# Patient Record
Sex: Female | Born: 1990 | Race: White | Hispanic: No | Marital: Single | Smoking: Current some day smoker
Health system: Southern US, Community
[De-identification: ages and names within clinical notes are randomized; demographics above are authoritative.]

---

## 2012-02-02 ENCOUNTER — Other Ambulatory Visit: Payer: Self-pay | Admitting: Family Medicine

## 2012-02-02 MED ORDER — NORETHINDRONE 0.35 MG PO TABS: 1.0000 | ORAL_TABLET | Freq: Every day | ORAL | Status: DC

## 2012-10-30 ENCOUNTER — Ambulatory Visit: Payer: BC Managed Care – PPO

## 2012-10-30 ENCOUNTER — Ambulatory Visit (INDEPENDENT_AMBULATORY_CARE_PROVIDER_SITE_OTHER): Payer: BC Managed Care – PPO | Admitting: Family Medicine

## 2012-10-30 VITALS — BP 112/63 | HR 64 | Temp 98.1°F | Resp 16 | Ht 70.5 in | Wt 145.0 lb

## 2012-10-30 DIAGNOSIS — M79609 Pain in unspecified limb: Secondary | ICD-10-CM

## 2012-10-30 DIAGNOSIS — G43909 Migraine, unspecified, not intractable, without status migrainosus: Secondary | ICD-10-CM

## 2012-10-30 DIAGNOSIS — M79673 Pain in unspecified foot: Secondary | ICD-10-CM

## 2012-10-30 LAB — POCT URINE PREGNANCY: Preg Test, Ur: NEGATIVE

## 2012-10-30 MED ORDER — OXAPROZIN 600 MG PO TABS: ORAL_TABLET | ORAL | Status: DC

## 2012-10-30 MED ORDER — BUTALBITAL-APAP-CAFFEINE 50-325-40 MG PO TABS: 1.0000 | ORAL_TABLET | Freq: Four times a day (QID) | ORAL | Status: AC | PRN

## 2012-10-30 NOTE — Progress Notes (Signed)
Subjective: Patient has been having pain in the lateral aspect of her right foot since yesterday. 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 She usually wears 4 he feels, though yesterday she wore flat. Knows of no specific injury. Has not been doing any running or jumping, just long hours of standing and working back and forth in the business. It hurt in the evening even though she was elevating it. She's never had problems with them in the past.  Occasionally gets a migraine, usually severe. The pain medicine she had in the past for these have helped, but she is out of the medication and would like a refill.  Objective: Foot looks grossly okay. Head no major abnormalities compared to the opposite foot. Pulses adequate. Is able to wiggle toes okay. She's tender along the lateral aspect of the foot, at the tarsal metatarsal joint area and along the proximal fifth metatarsal.  Assessment: Foot pain Migraine  Plan: X-ray of foot. HCG was negative. [facility] reading (PRIMARY) by  Dr. Alwyn Ren No fracture noted  Will treat with anti-inflammatory medication and a firm sole postop shoe Refill the migraine medication. We pulled the old chart it looks like Fioricet was the medication she received.  Dx: Foot pain/strain midfoot Migraines

## 2012-10-30 NOTE — Patient Instructions (Addendum)
Take anti-inflammatory medicine, Daypro, twice daily. It with food.  Try to stay off the foot as much as she can this weekend to allow time to rest. Wear the firm sole shoe.  If not improving by 5-6 days from now return.

## 2013-12-15 ENCOUNTER — Ambulatory Visit (INDEPENDENT_AMBULATORY_CARE_PROVIDER_SITE_OTHER): Payer: BC Managed Care – PPO | Admitting: Family Medicine

## 2013-12-15 VITALS — BP 110/70 | HR 62 | Temp 98.0°F | Resp 16 | Ht 70.5 in | Wt 144.8 lb

## 2013-12-15 DIAGNOSIS — S40019A Contusion of unspecified shoulder, initial encounter: Secondary | ICD-10-CM

## 2013-12-15 DIAGNOSIS — S161XXA Strain of muscle, fascia and tendon at neck level, initial encounter: Secondary | ICD-10-CM

## 2013-12-15 DIAGNOSIS — G43909 Migraine, unspecified, not intractable, without status migrainosus: Secondary | ICD-10-CM

## 2013-12-15 DIAGNOSIS — S139XXA Sprain of joints and ligaments of unspecified parts of neck, initial encounter: Secondary | ICD-10-CM

## 2013-12-15 DIAGNOSIS — S40012A Contusion of left shoulder, initial encounter: Secondary | ICD-10-CM

## 2013-12-15 MED ORDER — METHOCARBAMOL 500 MG PO TABS
ORAL_TABLET | ORAL | Status: DC
Start: 2013-12-15 — End: 2014-06-19

## 2013-12-15 MED ORDER — BUTALBITAL-APAP-CAFFEINE 50-325-40 MG PO TABS: 1.0000 | ORAL_TABLET | Freq: Four times a day (QID) | ORAL | Status: AC | PRN
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
## 2013-12-15 NOTE — Patient Instructions (Signed)
Take the muscle relaxants 1 in the morning, one afternoon, and 2 at bedtime. May want to skip the morning pill a day she working.  Apply ice or ice alternating with heat several times daily to the left shoulder into your neck  Take the ibuprofen 800 mg 3 times daily as needed for pain  Use the Fioricet as needed for headaches.

## 2014-06-19 ENCOUNTER — Emergency Department (HOSPITAL_COMMUNITY)
Admission: EM | Admit: 2014-06-19 | Discharge: 2014-06-19 | Disposition: A | Discharge status: Home / Self Care | Payer: BC Managed Care – PPO | Attending: Emergency Medicine | Admitting: Emergency Medicine

## 2014-06-19 ENCOUNTER — Encounter (HOSPITAL_COMMUNITY): Payer: Self-pay | Admitting: Emergency Medicine

## 2014-06-19 DIAGNOSIS — K029 Dental caries, unspecified: Secondary | ICD-10-CM | POA: Insufficient documentation

## 2014-06-19 DIAGNOSIS — K089 Disorder of teeth and supporting structures, unspecified: Secondary | ICD-10-CM | POA: Insufficient documentation

## 2014-06-19 DIAGNOSIS — F17200 Nicotine dependence, unspecified, uncomplicated: Secondary | ICD-10-CM | POA: Diagnosis not present

## 2014-06-19 DIAGNOSIS — K0889 Other specified disorders of teeth and supporting structures: Secondary | ICD-10-CM

## 2014-06-19 DIAGNOSIS — Z791 Long term (current) use of non-steroidal anti-inflammatories (NSAID): Secondary | ICD-10-CM | POA: Diagnosis not present

## 2014-06-19 MED ORDER — HYDROCODONE-ACETAMINOPHEN 5-325 MG PO TABS
2.0000 | ORAL_TABLET | Freq: Once | ORAL | Status: AC
  Administered 2014-06-19: 2 via ORAL
  Filled 2014-06-19: qty 2

## 2014-06-19 MED ORDER — PENICILLIN V POTASSIUM 500 MG PO TABS: 500.0000 mg | ORAL_TABLET | Freq: Four times a day (QID) | ORAL | Status: AC

## 2014-06-19 MED ORDER — HYDROCODONE-ACETAMINOPHEN 5-325 MG PO TABS: 1.0000 | ORAL_TABLET | Freq: Four times a day (QID) | ORAL | Status: AC | PRN

## 2014-06-19 NOTE — ED Notes (Signed)
Pt c/o dental pain: Lt. Posterior. Teeth have broken off. Pt states she has dentist but has not followed up to have a procedure d/t to no pain.

## 2014-06-19 NOTE — Discharge Instructions (Signed)
Dental Pain °A tooth ache may be caused by cavities (tooth decay). Cavities expose the nerve of the tooth to air and hot or cold temperatures. It may come from an infection or abscess (also called a boil or furuncle) around your tooth. It is also often caused by dental caries (tooth decay). This causes the pain you are having. °DIAGNOSIS  °Your caregiver can diagnose this problem by exam. °TREATMENT  °· If caused by an infection, it may be treated with medications which kill germs (antibiotics) and pain medications as prescribed by your caregiver. Take medications as directed. °· Only take over-the-counter or prescription medicines for pain, discomfort, or fever as directed by your caregiver. °· Whether the tooth ache today is caused by infection or dental disease, you should see your dentist as soon as possible for further care. °SEEK MEDICAL CARE IF: °The exam and treatment you received today has been provided on an emergency basis only. This is not a substitute for complete medical or dental care. If your problem worsens or new problems (symptoms) appear, and you are unable to meet with your dentist, call or return to this location. °SEEK IMMEDIATE MEDICAL CARE IF:  °· You have a fever. °· You develop redness and swelling of your face, jaw, or neck. °· You are unable to open your mouth. °· You have severe pain uncontrolled by pain medicine. °MAKE SURE YOU:  °· Understand these instructions. °· Will watch your condition. °· Will get help right away if you are not doing well or get worse. °Document Released: 11/10/2005 Document Revised: 02/02/2012 Document Reviewed: 06/28/2008 °ExitCare® Patient Information ©2015 [employer]. This information is not intended to replace advice given to you by your health care provider. Make sure you discuss any questions you have with your health care provider. ° °Emergency Department Resource Guide °1) Find a Doctor and Pay Out of Pocket °Although you won't have to find out who  is covered by your insurance plan, it is a good idea to ask around and get recommendations. You will then need to call the office and see if the doctor you have chosen will accept you as a new patient and what types of options they offer for patients who are self-pay. Some doctors offer discounts or will set up payment plans for their patients who do not have insurance, but you will need to ask so you aren't surprised when you get to your appointment. ° °2) Contact Your Local Health Department °Not all health departments have doctors that can see patients for sick visits, but many do, so it is worth a call to see if yours does. If you don't know where your local health department is, you can check in your phone book. The CDC also has a tool to help you locate your state's health department, and many state websites also have listings of all of their local health departments. ° °3) Find a Walk-in Clinic °If your illness is not likely to be very severe or complicated, you may want to try a walk in clinic. These are popping up all over the country in pharmacies, drugstores, and shopping centers. They're usually staffed by nurse practitioners or physician assistants that have been trained to treat common illnesses and complaints. They're usually fairly quick and inexpensive. However, if you have serious medical issues or chronic medical problems, these are probably not your best option. ° °No Primary Care Doctor: °- Call [facility] at  832-8000 - they can help you locate a primary   care doctor that  accepts your insurance, provides certain services, etc. °- Physician Referral Service- 1-800-533-3463 ° °Chronic Pain Problems: °Organization         Address  Phone   Notes  °[facility]  (336) 297-2271 Patients need to be referred by their primary care doctor.  ° °Medication Assistance: °Organization         Address  Phone   Notes  °[place] [facility] [address]  [address] °[place], [place] [address] (336) 641-8030 --Must be a resident of [place] °-- Must have NO insurance coverage whatsoever (no Medicaid/ Medicare, etc.) °-- The pt. MUST have a primary care doctor that directs their care regularly and follows them in the community °  °[facility]  (866) 331-1348   °[employer]  (888) 892-1162   ° °Agencies that provide inexpensive medical care: °Organization         Address  Phone   Notes  °[facility]  (336) 832-8035   °[facility]    (336) 832-7272   °[facility] [address] °[place], [place] [address] (336) 832-4777   °[facility] [address], °[place] (336) 271-4999   °[facility]    (336) 373-0678   °[facility]    (336) 272-1050   °[facility] ° [address], [place] Phone:  (336) 832-4444, Fax:  (336) 832-4440 Hours of Operation:  9 am - 6 pm, M-F.  Also accepts Medicaid/Medicare and self-pay.  ° [facility] ° [address], [place] Phone: (336) 832-3150, Fax: (336) 832-3151. Hours of Operation:  8:30 am - 5:30 pm, M-F.  Also accepts Medicaid and self-pay.  °[facility] [place] [address], [place] Phone: (336) 878-6027   °[facility] [address], [place], [place] (336)723-1848, Ext. 123 Mondays & Thursdays: 7-9 AM.  First 15 patients are seen on a first come, first serve basis. °  ° °Medicaid-accepting [place] Providers: ° °Organization         Address  Phone   Notes  °[facility] [address], [place] (336) 641-2100 Also accepts self-pay patients.  °[facility] [address], [place] ° (336) 856-9996   °[facility] [address], [place] (336) 288-8857   °[facility] [address] [place] [address], [place] (336) 299-7000   °Veita Bland [address], [place]  ° (336) 373-1557 Only accepts [place] Access Medicaid patients after they have their name applied to their card.  ° °Self-Pay (no insurance) in [place]: ° °Organization         Address  Phone   Notes  °Sickle Cell Patients, [facility] [address], [place] (336) 832-1970   °[facility] [address], [place] (336) 832-4400   °[facility] ° [address], [place] (336) 992-4800   °[facility]/Dr. Osei-Bonsu ° [address] [place] [address], [place] or [address], [place] (336) 841-8500 Phone number for both [place] and [place] locations is the same.  °[facility] [address], [place] (336) 299-0000   °[facility]  [address] [place] [address],  or [address] (336) 852-7530 °(336) 878-2260   °  [facility] [address], [place] (336) 350-1642, phone; (336) 294-5005, fax Sees patients 1st and 3rd Saturday of every month.  Must not qualify for public or private insurance (i.e. Medicaid, Medicare, [place] Health Choice, Veterans' Benefits) • Household income should be no more than 200% of the poverty level •The clinic cannot treat you if you are pregnant or think you are pregnant • Sexually transmitted diseases are not treated at the clinic.  ° ° °Dental Care: °Organization         Address  Phone  Notes  °[facility] [facility] [address], [place] (336) 641-6152 Accepts children up to age 21 who are enrolled in Medicaid or [place] Health Choice; pregnant women with a Medicaid card; and children who have applied for Medicaid or [place] Health Choice, but were declined, whose parents can pay a reduced fee at time of service.  °[facility] [place]  [address], [place] (336) 641-7733 Accepts children up to age 21 who are  enrolled in Medicaid or [place] Health Choice; pregnant women with a Medicaid card; and children who have applied for Medicaid or [place] Health Choice, but were declined, whose parents can pay a reduced fee at time of service.  °[facility] ° [address], [place] (336) 641-4533 Patients are seen by appointment only. Walk-ins are not accepted. [facility] will see patients 18 years of age and older. °Monday - Tuesday (8am-5pm) °Most Wednesdays (8:30-5pm) °$30 per visit, cash only  °[facility] ° [address], [place] (336) 641-4533 Patients are seen by appointment only. Walk-ins are not accepted. [facility] will see patients 18 years of age and older. °One Wednesday Evening (Monthly: Volunteer Based).  $30 per visit, cash only  °[facility]  (919) 537-3737 for adults; Children under age 4, call Graduate Pediatric Dentistry at (919) [phone]. Children aged 4-14, please call (919) [phone] to request a pediatric application. ° Dental services are provided in all areas of dental care including fillings, crowns and bridges, complete and partial dentures, implants, gum treatment, root canals, and extractions. Preventive care is also provided. Treatment is provided to both adults and children. °Patients are selected via a lottery and there is often a waiting list. °  °[facility] [address], °[place] ° (336) 763-8833 [email or web address] °  °[facility] [address], [place], [place] (336)723-1848, Ext. 123 Second and Fourth Thursday of each month, opens at 6:30 AM; Clinic ends at 9 AM.  Patients are seen on a first-come first-served basis, and a limited number are seen during each clinic.  ° °[facility] ° [address], [place], [place] (336) 723-7904   Eligibility Requirements °You must have lived in [place], [place], or [place] counties for at least the last three months. °  You  cannot be eligible for state or federal sponsored healthcare insurance, including Veterans Administration, Medicaid, or Medicare. °  You generally cannot be eligible for healthcare insurance through your employer.  °  How to apply: °Eligibility screenings are held every Tuesday and Wednesday afternoon from 1:00 pm until 4:00 pm. You do not need an appointment for the interview!  °[facility] [address], [place], [place] 336-631-2330   °[place] [facility]  336-342-8273   °[facility]  336-703-3100   °[facility]  336-570-6415   ° °Behavioral Health Resources in the Community: °Intensive Outpatient Programs °Organization         Address  Phone  Notes  °[facility] [address], [place], [place] 336-878-6098   °[facility] [address], [place], [place] 336-832-9800   °[facility] [address], [place], [place] ° 336-882-2125   °[facility] [address],  °[place], [place] 1-800-853-5163 or 336-641-4981   °Substance Abuse Resources °Organization         Address  Phone  Notes  °[facility]  336-882-2125   °[facility]  336-784-9470   °[facility]  336-285-9073   °[facility]  336-845-3988   °Residential & Outpatient Substance Abuse Program  1-800-659-3381   °Psychological Services °Organization         Address  Phone  Notes  °[facility]  336- 832-9600   °[facility]  336- 378-7881   °[facility] [address], [place] 1-800-853-5163 or 336-641-4981   ° °Mobile Crisis Teams °Organization         Address  Phone  Notes  °[facility], Mobile Crisis Care Unit  1-877-626-1772   °Assertive °[facility] ° [address] [place], [place] 336-834-9664   °Sharon DeEsch [address] °[place] 336-554-5454   ° °Self-Help/Support  Groups °Organization         Address  Phone             Notes  °[facility] [place] - variety of support groups  336- 373-1402 Call for more information  °Narcotics Anonymous (NA), [facility] [address], °[place]  2 meetings at this location  ° °Residential Treatment Programs °Organization         Address  Phone  Notes  °[facility] [address],    °[place]  1-866-801-8205   °[facility] ° [address], [place], [place] 704-293-8524   °[facility] [address], [place] 336-845-3988 Admissions: 8am-3pm M-F  °[facility] [address]    °[place], [place] 336-841-1104   °[facility] [address], [place], [place] 336-379-7146   °[facility] [address]  °[place], [place] 336-285-9073   °[facility] Intensive Outpatient [address], [place], [place] 336-852-3033   °[facility] [address]  °[place], [place] 1-877-615-2722 or 336-784-9470   °[facility] [address] [place], [place] 336-227-7417 Accepts Medicaid  °[facility] [address]  ° [place] 1-800-659-3381 Substance Abuse/Addiction Treatment  ° °[place] Behavioral Health Resources °Organization         Address  Phone  Notes  °[facility]  (888) 581-9988   °Julie Brannon, PhD [address] [place], [place]   (336) 349-5553 or (336) 951-0000   °[facility]   [address] °[place], [place] (336) 349-4454   °[facility] [address], [place], [place] (336) 342-8316 Insurance/Medicaid/sponsorship through [facility]  °[facility] [address]                                    [place],  (336) 342-8316 Therapy/tele-psych/case  °[facility]   [address]  ° [place], [place] (336) 349-2233    °Dr. Arfeen  (336) 349-4544   °[facility] [place]  [place]  [employer] [facility] [place] [facility] 1) [address], [place] °2) [address], [place] °3)  [address], [place] (336) 349-3220 °(336) 342-7768 ° °(336) 342-8140   °[place] [facility] (336) 342-1394 or (336) 342-3537 (After Hours)    ° ° ° °

## 2014-06-19 NOTE — ED Provider Notes (Signed)
Medical screening examination/treatment/procedure(s) were performed by non-physician practitioner and as supervising physician I was immediately available for consultation/collaboration.   EKG Interpretation None        Loren Raceravid Fawaz Borquez, MD 06/19/14 [id number]

## 2014-06-19 NOTE — ED Notes (Signed)
Pt c/o dental pain in the lower left part of the mouth.  Pt has had the pain since last week but the pain became severe yesterday.

## 2014-06-19 NOTE — ED Provider Notes (Signed)
CSN: [id number]     Arrival date & time 06/19/14  0046 History   First MD Initiated Contact with Patient 06/19/14 0119     Chief Complaint  Patient presents with  . Dental Pain    (Consider location/radiation/quality/duration/timing/severity/associated sxs/prior Treatment) Patient is a 23 y.o. female presenting with tooth pain. The history is provided by the patient. No language interpreter was used.  Dental Pain Location:  Lower Lower teeth location:  31/RL 2nd molar Quality:  Aching and throbbing Severity:  Moderate Onset quality:  Gradual Duration:  4 days Timing:  Constant Progression:  Worsening Chronicity:  New Context: dental caries and poor dentition   Context: not recent dental surgery and not trauma   Relieved by:  NSAIDs (mild) Worsened by:  Touching and pressure Associated symptoms: facial pain   Associated symptoms: no difficulty swallowing, no drooling, no facial swelling, no fever, no oral bleeding, no oral lesions and no trismus   Risk factors: lack of dental care and smoking     History reviewed. No pertinent past medical history. History reviewed. No pertinent past surgical history. Family History  Problem Relation Age of Onset  . Liver cancer Father    History  Substance Use Topics  . Smoking status: Current Some Day Smoker  . Smokeless tobacco: Not on file  . Alcohol Use: 0.5 - 1.0 oz/week    1-2 drink(s) per week   OB History   Grav Para Term Preterm Abortions TAB SAB Ect Mult Living                  Review of Systems  Constitutional: Negative for fever.  HENT: Positive for dental problem. Negative for drooling, facial swelling and mouth sores.   All other systems reviewed and are negative.    Allergies  Review of patient's allergies indicates no known allergies.  Home Medications   Prior to Admission medications   Medication Sig Start Date End Date Taking? Authorizing Provider  ibuprofen (ADVIL,MOTRIN) 200 MG tablet Take 400 mg by  mouth every 6 (six) hours as needed for mild pain.   Yes Historical Provider, MD  naproxen sodium (ALEVE) 220 MG tablet Take 220 mg by mouth 2 (two) times daily with a meal.   Yes Historical Provider, MD  butalbital-acetaminophen-caffeine (FIORICET) 50-325-40 MG per tablet Take 1-2 tablets by mouth every 6 (six) hours as needed for headache. 12/15/13 12/15/14  Peyton Najjar, MD  HYDROcodone-acetaminophen (NORCO/VICODIN) 5-325 MG per tablet Take 1 tablet by mouth every 6 (six) hours as needed for moderate pain or severe pain. 06/19/14   Antony Madura, PA-C  penicillin v potassium (VEETID) 500 MG tablet Take 1 tablet (500 mg total) by mouth 4 (four) times daily. 06/19/14 06/26/14  Antony Madura, PA-C   BP 118/71  Pulse 66  Temp(Src) 98 F (36.7 C) (Oral)  Resp 17  Ht 5\' 9"  (1.753 m)  Wt 144 lb (65.318 kg)  BMI 21.26 kg/m2  SpO2 100%  LMP 06/09/2014  Physical Exam  Nursing note and vitals reviewed. Constitutional: She is oriented to person, place, and time. She appears well-developed and well-nourished. No distress.  Nontoxic/nonseptic appearing  HENT:  Head: Normocephalic and atraumatic.  Mouth/Throat: Uvula is midline, oropharynx is clear and moist and mucous membranes are normal. No oral lesions. No trismus in the jaw. Abnormal dentition. Dental caries present. No dental abscesses or uvula swelling.    No trismus. Patient tolerating secretions without difficulty.  Eyes: Conjunctivae and EOM are normal. Pupils are equal,  round, and reactive to light. No scleral icterus.  Neck: Normal range of motion. Neck supple.  No nuchal rigidity or meningismus  Pulmonary/Chest: Effort normal. No respiratory distress.  Musculoskeletal: Normal range of motion.  Neurological: She is alert and oriented to person, place, and time.  Skin: Skin is warm and dry. No rash noted. She is not diaphoretic. No erythema. No pallor.  Psychiatric: She has a normal mood and affect. Her behavior is normal.    ED Course    Procedures (including critical care time) Labs Review Labs Reviewed - No data to display  Imaging Review No results found.   EKG Interpretation None      MDM   Final diagnoses:  Dentalgia    Patient with toothache x 4 days. No gross abscess. Exam unconcerning for Ludwig's angina or spread of infection. Will treat with penicillin and pain medicine. Urged patient to follow-up with dentist. Referral in resource guide provided. Return precautions discussed. Patient agreeable to plan with no unaddressed concerns.   Filed Vitals:   06/19/14 0059  BP: 118/71  Pulse: 66  Temp: 98 F (36.7 C)  TempSrc: Oral  Resp: 17  Height: 5\' 9"  (1.753 m)  Weight: 144 lb (65.318 kg)  SpO2: 100%        Antony MaduraKelly Lazer Wollard, PA-C 06/19/14 0222

## 2014-07-22 IMAGING — CR DG FOOT COMPLETE 3+V*R*
3 series · 3 of 3 positions shown · non-contrast
Comparison: Preliminary reading Dr. Tiger

CLINICAL DATA: Right foot pain

RIGHT FOOT COMPLETE - 3+ VIEW

[AP]
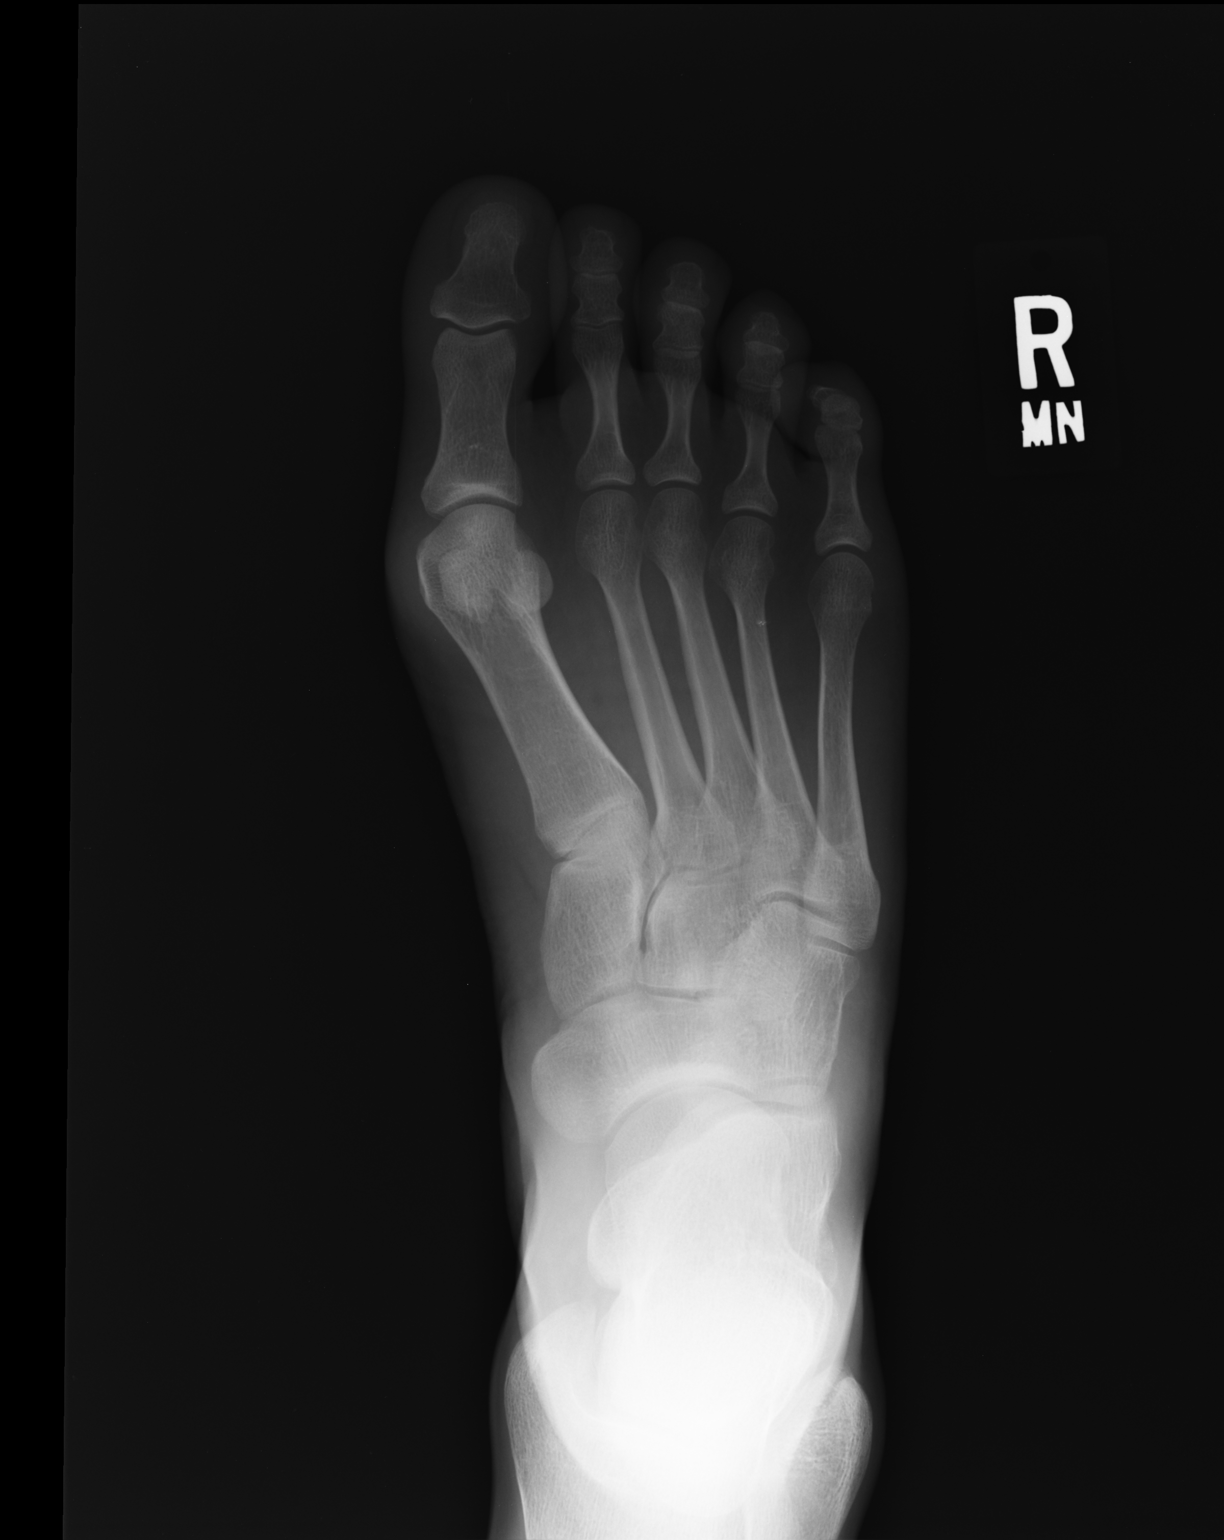

[ap obl int rot]
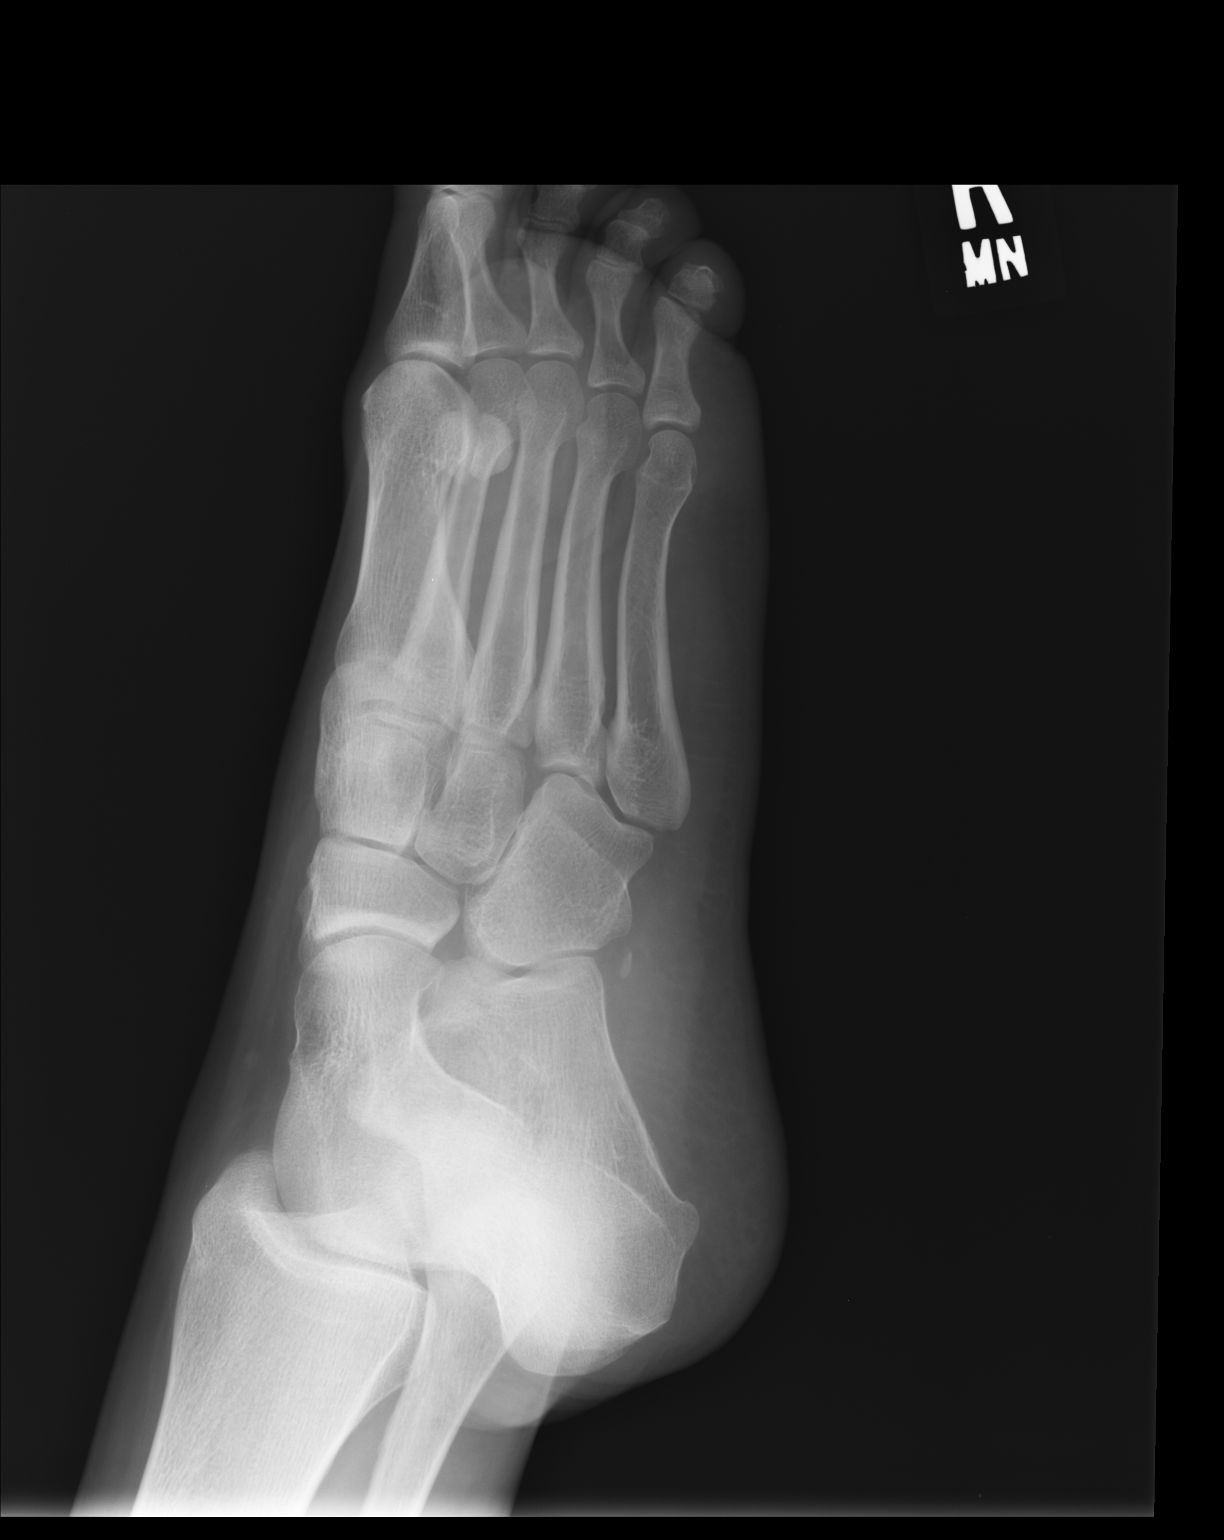

[lateral]
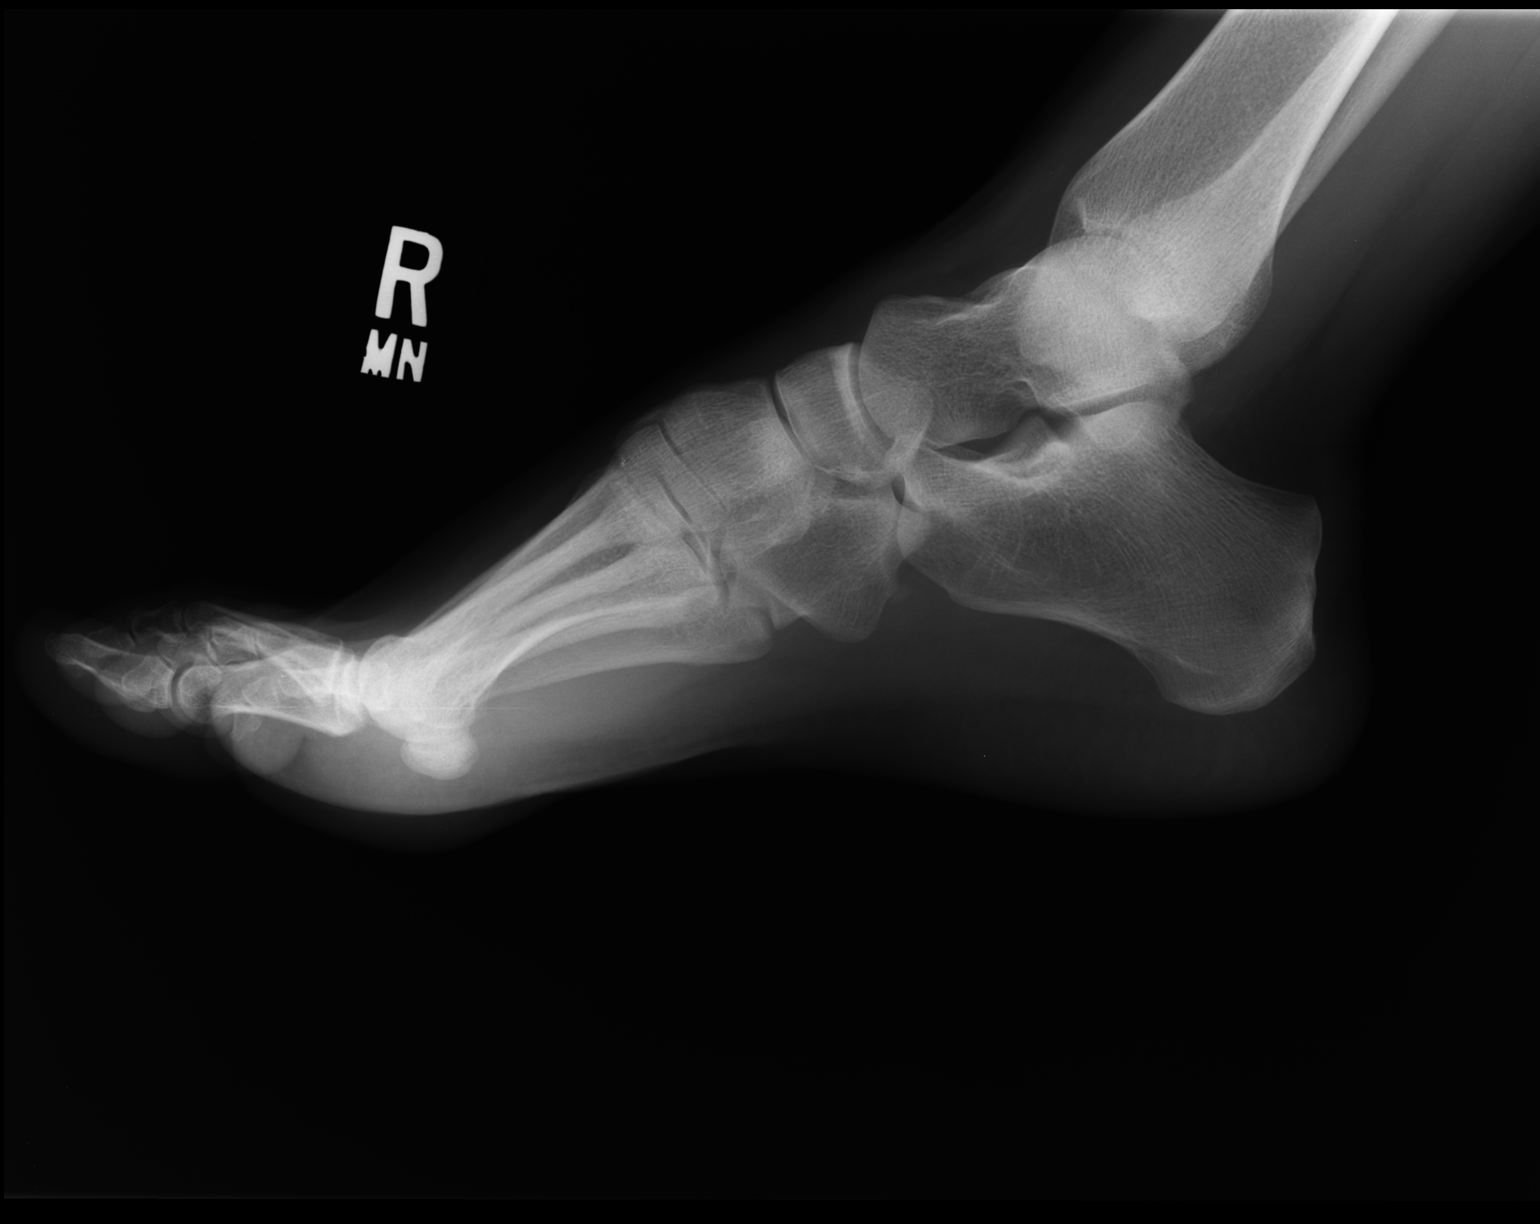

[3 of 3 positions shown; findings below may reference images not displayed]

FINDINGS: Three views of the right foot submitted.  No acute
fracture or subluxation.  No radiopaque foreign body.
IMPRESSION: No acute fracture or subluxation.
